# Patient Record
Sex: Female | Born: 1970 | Race: White | Hispanic: Yes | Marital: Married | Smoking: Never smoker
Health system: Southern US, Community
[De-identification: ages and names within clinical notes are randomized; demographics above are authoritative.]

## PROBLEM LIST (undated history)

## (undated) DIAGNOSIS — I10 Essential (primary) hypertension: Secondary | ICD-10-CM

## (undated) HISTORY — DX: Essential (primary) hypertension: I10

---

## 2020-04-20 ENCOUNTER — Other Ambulatory Visit: Payer: Self-pay

## 2020-04-20 DIAGNOSIS — Z1231 Encounter for screening mammogram for malignant neoplasm of breast: Secondary | ICD-10-CM

## 2020-05-23 ENCOUNTER — Ambulatory Visit: Payer: No Typology Code available for payment source | Admitting: *Deleted

## 2020-05-23 ENCOUNTER — Other Ambulatory Visit: Payer: Self-pay

## 2020-05-23 ENCOUNTER — Encounter (INDEPENDENT_AMBULATORY_CARE_PROVIDER_SITE_OTHER): Payer: Self-pay

## 2020-05-23 ENCOUNTER — Ambulatory Visit
Admission: RE | Admit: 2020-05-23 | Discharge: 2020-05-23 | Disposition: A | Discharge status: Home / Self Care | Payer: No Typology Code available for payment source | Source: Ambulatory Visit | Attending: Student | Admitting: Student

## 2020-05-23 VITALS — BP 168/118 | Wt 179.0 lb

## 2020-05-23 DIAGNOSIS — Z01419 Encounter for gynecological examination (general) (routine) without abnormal findings: Secondary | ICD-10-CM

## 2020-05-23 NOTE — Patient Instructions (Signed)
Explained breast self awareness with Jesusita Oka. Pap smear completed today. Let her know BCCCP will cover Pap smears and HPV typing every 5 years unless has a history of abnormal Pap smears. Referred patient to the [facility] of [facility] for a screening mammogram on the mobile unit. Appointment scheduled Tuesday, May 23, 2020 at 1140. Patient escorted to the mobile unit following BCCCP appointment for her screening mammogram. Let patient know will follow up with her within the next couple weeks with results of her Pap smear by letter or phone. Informed patient that the Breast Center will follow-up with her within the next couple of weeks with results of her mammogram by letter or phone. Jesusita Oka verbalized understanding.  Treon Kehl, Kathaleen Maser, RN 10:54 AM

## 2020-05-23 NOTE — Progress Notes (Signed)
Marisa Cox is a 50 y.o. G3P0 female who presents to [facility] clinic today with no complaints.    Pap Smear: Pap smear completed today. Last Pap smear was 3 years ago at a clinic in [place] that was located at [facility] and [employer] and was normal per patient. Per patient has no history of an abnormal Pap smear. Last Pap smear result is not available in Epic.   Physical exam: Breasts Left breast larger than right breast that per patient is normal for her. No skin abnormalities bilateral breasts. No nipple retraction bilateral breasts. No nipple discharge bilateral breasts. No lymphadenopathy. No lumps palpated bilateral breasts. No complaints of pain or tenderness on exam.      Pelvic/Bimanual Ext Genitalia No lesions, no swelling and no discharge observed on external genitalia.        Vagina Vagina pink and normal texture. No lesions and blood observed in vagina consistent with the end of patients menstrual period.        Cervix Cervix is present. Cervix pink and of normal texture. Blood observed on cervix that is consistent with the end of patients menstrual cycle.    Uterus Uterus is present and palpable. Uterus in normal position and normal size.        Adnexae Bilateral ovaries present and palpable. No tenderness on palpation.         Rectovaginal No rectal exam completed today since patient had no rectal complaints. No skin abnormalities observed on exam.     Smoking History: Patient has never smoked.   Patient Navigation: Patient education provided. Access to services provided for patient through [place] program. Spanish interpreter Natale Lay from [facility] provided.  Colorectal Cancer Screening: Per patient has never had colonoscopy completed. No complaints today.    Breast and Cervical Cancer Risk Assessment: Patient does not have family history of breast cancer, known genetic mutations, or radiation treatment to the chest before age 48. Patient does  not have history of cervical dysplasia, immunocompromised, or DES exposure in-utero.  Risk Assessment    Risk Scores      05/23/2020   Last edited by: Meryl Dare, CMA   5-year risk: 0.5 %   Lifetime risk: 5.2 %          A: BCCCP exam with pap smear No complaints.  P: Referred patient to the [facility] of [facility] for a screening mammogram on the mobile unit. Appointment scheduled Tuesday, May 23, 2020 at 1140.  Priscille Heidelberg, RN 05/23/2020 10:54 AM

## 2020-05-24 LAB — CYTOLOGY - PAP
Comment: NEGATIVE
Diagnosis: NEGATIVE
High risk HPV: NEGATIVE

## 2020-05-25 ENCOUNTER — Telehealth: Payer: Self-pay

## 2020-05-25 NOTE — Telephone Encounter (Signed)
Called patient to give pap smear results via Erika McReynolds, [employer]. Informed patient that pap smear was normal and HPV was negative. Based on this result her next pap smear will be due in 5 years. Patient voiced understanding.  

## 2020-05-26 ENCOUNTER — Other Ambulatory Visit: Payer: Self-pay | Admitting: Obstetrics and Gynecology

## 2020-05-26 DIAGNOSIS — R928 Other abnormal and inconclusive findings on diagnostic imaging of breast: Secondary | ICD-10-CM

## 2020-06-18 HISTORY — PX: BREAST BIOPSY: SHX20

## 2020-06-21 ENCOUNTER — Other Ambulatory Visit: Payer: Self-pay | Admitting: Obstetrics and Gynecology

## 2020-06-21 ENCOUNTER — Ambulatory Visit
Admission: RE | Admit: 2020-06-21 | Discharge: 2020-06-21 | Disposition: A | Discharge status: Home / Self Care | Payer: No Typology Code available for payment source | Source: Ambulatory Visit | Attending: Obstetrics and Gynecology | Admitting: Obstetrics and Gynecology

## 2020-06-21 ENCOUNTER — Other Ambulatory Visit: Payer: Self-pay

## 2020-06-21 DIAGNOSIS — R928 Other abnormal and inconclusive findings on diagnostic imaging of breast: Secondary | ICD-10-CM

## 2020-06-22 ENCOUNTER — Ambulatory Visit
Admission: RE | Admit: 2020-06-22 | Discharge: 2020-06-22 | Disposition: A | Discharge status: Home / Self Care | Payer: No Typology Code available for payment source | Source: Ambulatory Visit | Attending: Obstetrics and Gynecology | Admitting: Obstetrics and Gynecology

## 2020-06-22 ENCOUNTER — Other Ambulatory Visit: Payer: Self-pay | Admitting: Obstetrics and Gynecology

## 2020-06-22 DIAGNOSIS — R928 Other abnormal and inconclusive findings on diagnostic imaging of breast: Secondary | ICD-10-CM

## 2021-03-05 ENCOUNTER — Other Ambulatory Visit: Payer: Self-pay

## 2021-03-05 DIAGNOSIS — Z1231 Encounter for screening mammogram for malignant neoplasm of breast: Secondary | ICD-10-CM

## 2021-05-22 ENCOUNTER — Other Ambulatory Visit: Payer: Self-pay

## 2021-05-22 NOTE — Progress Notes (Signed)
error 

## 2021-05-24 ENCOUNTER — Ambulatory Visit: Payer: No Typology Code available for payment source | Admitting: *Deleted

## 2021-05-24 ENCOUNTER — Ambulatory Visit
Admission: RE | Admit: 2021-05-24 | Discharge: 2021-05-24 | Disposition: A | Discharge status: Home / Self Care | Payer: Self-pay | Source: Ambulatory Visit | Attending: Student | Admitting: Student

## 2021-05-24 VITALS — BP 124/76 | Wt 179.6 lb

## 2021-05-24 DIAGNOSIS — Z1211 Encounter for screening for malignant neoplasm of colon: Secondary | ICD-10-CM

## 2021-05-24 DIAGNOSIS — Z1231 Encounter for screening mammogram for malignant neoplasm of breast: Secondary | ICD-10-CM

## 2021-05-24 DIAGNOSIS — Z1239 Encounter for other screening for malignant neoplasm of breast: Secondary | ICD-10-CM

## 2021-05-24 NOTE — Patient Instructions (Signed)
Explained breast self awareness with Marisa Cox. Patient did not need a Pap smear today due to last Pap smear and HPV typing was 05/23/2020. Let her know BCCCP will cover Pap smears and HPV typing every 5 years unless has a history of abnormal Pap smears. Referred patient to the [facility] for a screening mammogram on the mobile unit. Appointment scheduled Thursday, May 24, 2021 at 0930. Patient aware of appointment and will be there. Let patient know the Breast Center will follow up with her within the next couple weeks with results of her mammogram by letter or phone. Marisa Cox verbalized understanding. ? ?Marisa Cox, Arvil Chaco, RN ?9:11 AM ? ? ? ? ?

## 2021-05-24 NOTE — Progress Notes (Signed)
[name] is a 51 y.o. female who presents to [facility] clinic today with no complaints.  ?  ?Pap Smear: Pap smear not completed today. Last Pap smear was 05/23/2020 at [facility] clinic and was normal with negative HPV. Per patient has no history of an abnormal Pap smear. Last Pap smear result is available in Epic. ?  ?Physical exam: ?Breasts ?Left breast larger than right breast that per patient is normal for her. No skin abnormalities bilateral breasts. No nipple retraction bilateral breasts. No nipple discharge bilateral breasts. No lymphadenopathy. No lumps palpated bilateral breasts. No complaints of pain or tenderness on exam. ? ?MM DIAG BREAST TOMO UNI LEFT ? ?Result Date: 06/21/2020 ?CLINICAL DATA:  Patient returns after baseline screening study for evaluation possible LEFT breast mass. EXAM: DIGITAL DIAGNOSTIC UNILATERAL LEFT MAMMOGRAM WITH TOMOSYNTHESIS AND CAD; ULTRASOUND LEFT BREAST LIMITED TECHNIQUE: Left digital diagnostic mammography and breast tomosynthesis was performed. The images were evaluated with computer-aided detection.; Targeted ultrasound examination of the left breast was performed COMPARISON:  Previous exam(s). ACR Breast Density Category b: There are scattered areas of fibroglandular density. FINDINGS: Additional 2-D and 3-D images are performed. These views confirm presence of an oval mass with irregular margins in the UPPER central anterior portion of the LEFT breast. Portions of the lesion appear lucent. On physical exam, there is no visible bruising in the UPPER central LEFT breast. Patient denies any known trauma. I palpate no abnormality in the UPPER central LEFT breast. Targeted ultrasound is performed, showing hyperechoic oval mass with angular margins and mild internal heterogeneity in the 12 o'clock location of the LEFT breast 1 centimeter from the nipple. Mass is 0.9 x 0.5 x 0.7 centimeters. Internal blood flow is confirmed on Doppler evaluation. No additional  abnormalities are identified in this location. Considerations include fat necrosis, hemangioma, and less likely, malignancy. Evaluation of the LEFT axilla is negative for adenopathy. IMPRESSION: Indeterminate mass in the 12 o'clock location of the LEFT breast 1 centimeter from the nipple. RECOMMENDATION: Recommend ultrasound-guided core biopsy of breast. I have discussed the findings and recommendations with the patient. If applicable, a reminder letter will be sent to the patient regarding the next appointment. BI-RADS CATEGORY  4: Suspicious. Electronically Signed   By: Marisa Cox M.D.   On: 06/21/2020 09:28  ? ?MS DIGITAL SCREENING TOMO BILATERAL ? ?Result Date: 05/25/2020 ?CLINICAL DATA:  Screening. EXAM: DIGITAL SCREENING BILATERAL MAMMOGRAM WITH TOMOSYNTHESIS AND CAD TECHNIQUE: Bilateral screening digital craniocaudal and mediolateral oblique mammograms were obtained. Bilateral screening digital breast tomosynthesis was performed. The images were evaluated with computer-aided detection. COMPARISON:  None. ACR Breast Density Category b: There are scattered areas of fibroglandular density. FINDINGS: In the left breast, a possible mass warrants further evaluation. In the right breast, no findings suspicious for malignancy. IMPRESSION: Further evaluation is suggested for a possible mass in the left breast. RECOMMENDATION: Diagnostic mammogram and possibly ultrasound of the left breast. (Code:[id number]) The patient will be contacted regarding the findings, and additional imaging will be scheduled. BI-RADS CATEGORY  0: Incomplete. Need additional imaging evaluation and/or prior mammograms for comparison. Electronically Signed   By: Marisa Cox M.D.   On: 05/25/2020 09:21  ? ?MM CLIP PLACEMENT LEFT ? ?Result Date: 06/22/2020 ?CLINICAL DATA:  Status post ultrasound-guided biopsy left breast mass. EXAM: DIAGNOSTIC LEFT MAMMOGRAM POST ULTRASOUND BIOPSY COMPARISON:  Previous exam(s). FINDINGS: Mammographic images  were obtained following ultrasound guided biopsy of left breast mass. The biopsy marking clip is in expected position at the site  of biopsy. IMPRESSION: Appropriate positioning of the ribbon shaped biopsy marking clip at the site of biopsy in the left breast 12 o'clock position. Final Assessment: Post Procedure Mammograms for Marker Placement Electronically Signed   By: Marisa Cox M.D.   On: 06/22/2020 08:27   ? ?Pelvic/Bimanual ?Pap is not indicated today per BCCCP guidelines. ?  ?Smoking History: ?Patient has never smoked. ?  ?Patient Navigation: ?Patient education provided. Access to services provided for patient through [place] program. Spanish interpreter Marisa Cox from [facility] provided.  ? ?Colorectal Cancer Screening: ?Per patient has never had colonoscopy completed. FIT Test given to patient today to complete. No complaints today.  ?  ?Breast and Cervical Cancer Risk Assessment: ?Patient does not have family history of breast cancer, known genetic mutations, or radiation treatment to the chest before age 69. Patient does not have history of cervical dysplasia, immunocompromised, or DES exposure in-utero. ? ?Risk Assessment   ? ? Risk Scores   ? ?   05/24/2021 05/23/2020  ? Last edited by: Marisa Rutherford, LPN Marisa Cox, CMA  ? 5-year risk: 0.7 % 0.5 %  ? Lifetime risk: 6 % 5.2 %  ? ?  ?  ? ?  ? ? ?A: ?BCCCP exam without pap smear ?No complaints. ? ?P: ?Referred patient to the [facility] of [facility] for a screening mammogram on the mobile unit. Appointment scheduled Thursday, May 24, 2021 at 0930. ?  ? ?Marisa Heidelberg, RN ?05/24/2021 9:11 AM   ?

## 2021-05-28 ENCOUNTER — Other Ambulatory Visit: Payer: Self-pay | Admitting: Obstetrics and Gynecology

## 2021-05-28 DIAGNOSIS — R928 Other abnormal and inconclusive findings on diagnostic imaging of breast: Secondary | ICD-10-CM

## 2021-06-25 ENCOUNTER — Ambulatory Visit
Admission: RE | Admit: 2021-06-25 | Discharge: 2021-06-25 | Disposition: A | Discharge status: Home / Self Care | Payer: No Typology Code available for payment source | Source: Ambulatory Visit | Attending: Obstetrics and Gynecology | Admitting: Obstetrics and Gynecology

## 2021-06-25 DIAGNOSIS — R928 Other abnormal and inconclusive findings on diagnostic imaging of breast: Secondary | ICD-10-CM

## 2022-03-14 ENCOUNTER — Telehealth: Payer: Self-pay

## 2022-03-14 NOTE — Telephone Encounter (Signed)
Telephoned patient using language line interpreter [phone]. Patient will call back with spouse's income and complete screening process with [facility].

## 2022-03-27 NOTE — Telephone Encounter (Signed)
Telephoned patient at mobile number. Left a voice message with [facility] (scholarship) contact information. Rudene Anda (interpreter)

## 2023-01-14 LAB — COLOGUARD: COLOGUARD: NEGATIVE

## 2023-07-17 IMAGING — US US BREAST*L* LIMITED INC AXILLA
1 series · 7 of 7 positions shown · non-contrast
Comparison: Previous exam(s).

CLINICAL DATA: Patient returns after screening study for evaluation
possible RIGHT breast asymmetry and possible LEFT breast asymmetry.



[Series 1: us breast*left* limited inc axilla · 0.06mm/px · 7 of 7 slices shown]
[im 1/7]
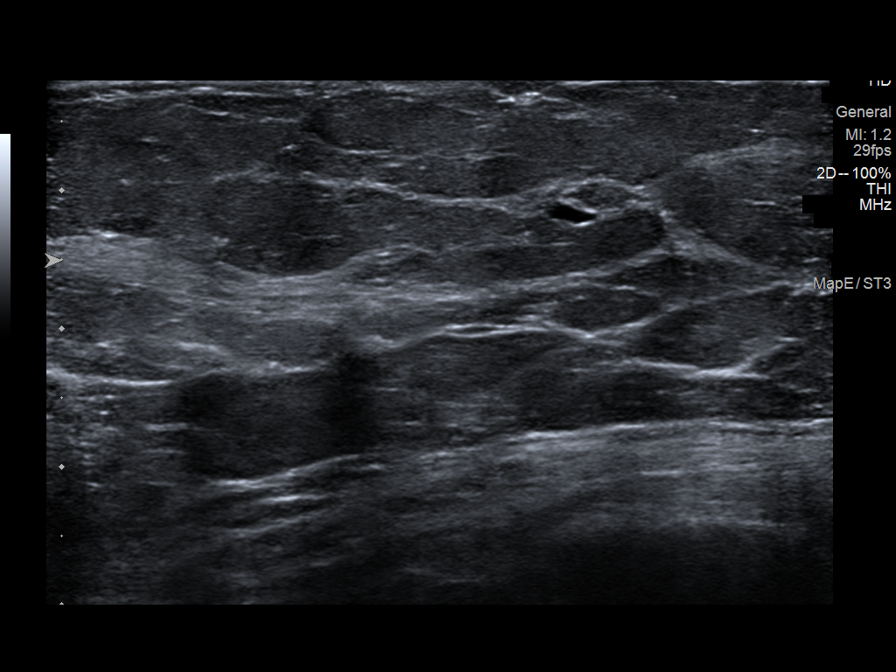
[im 2/7]
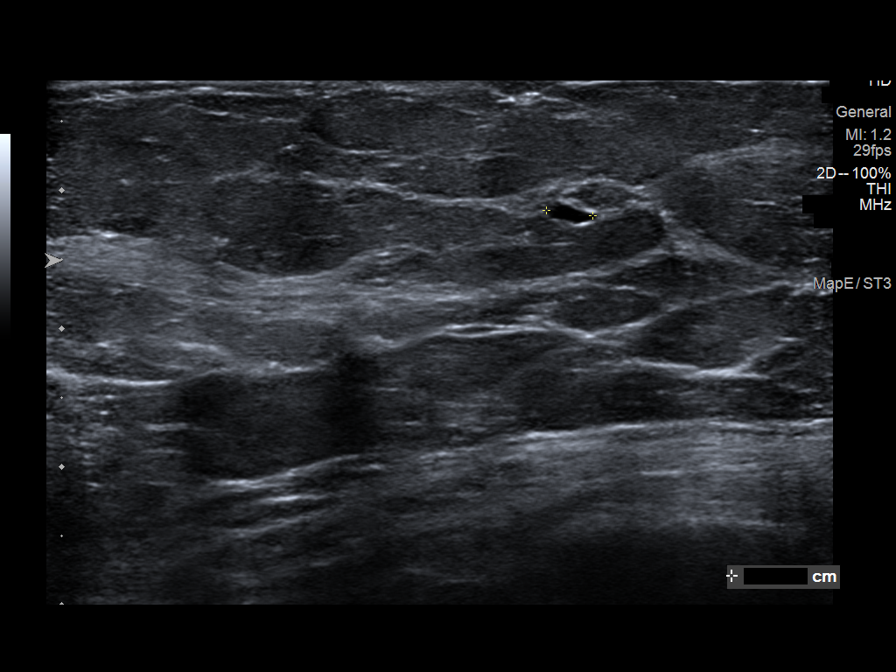
[im 3/7]
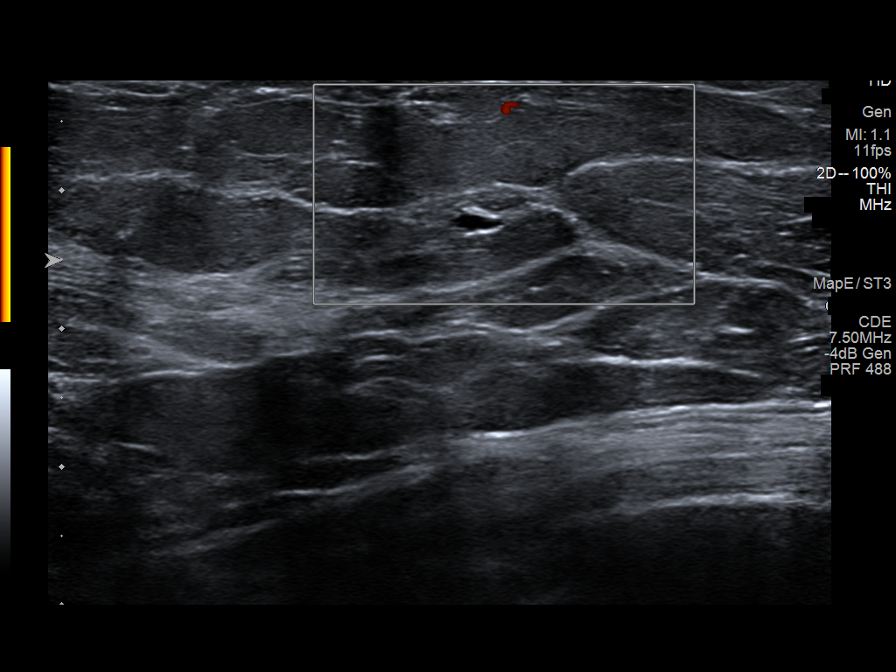
[im 4/7]
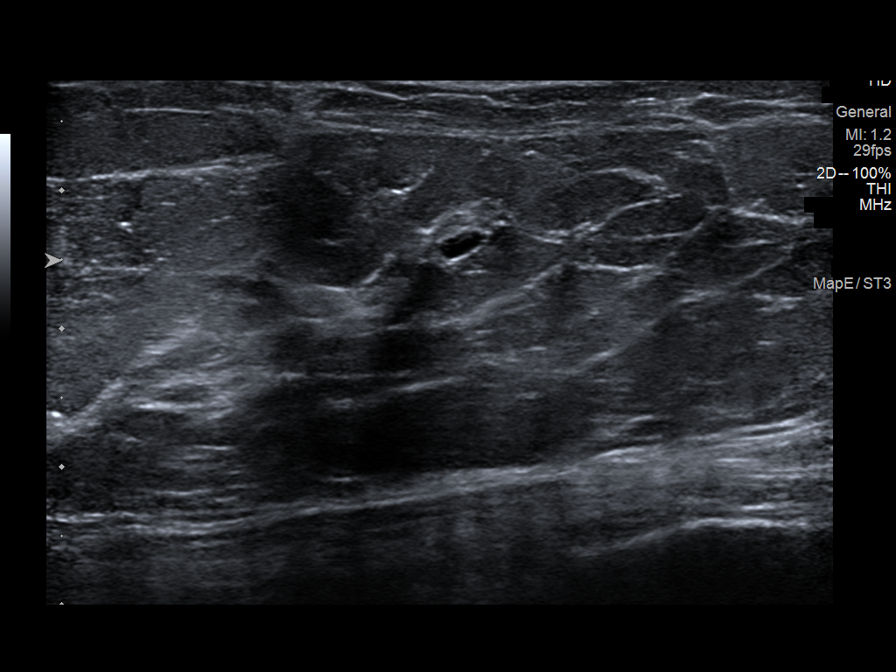
[im 5/7]
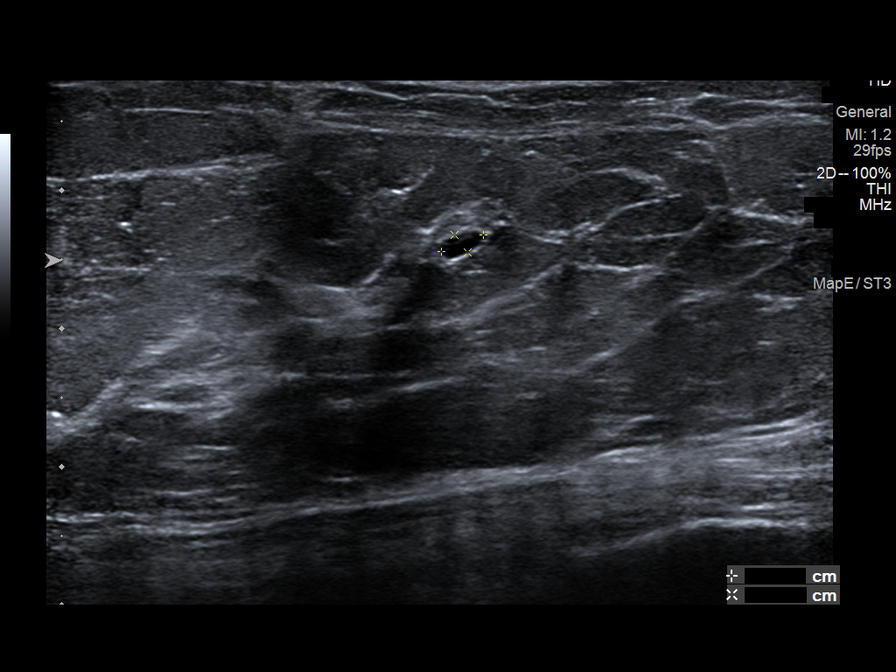
[im 6/7]
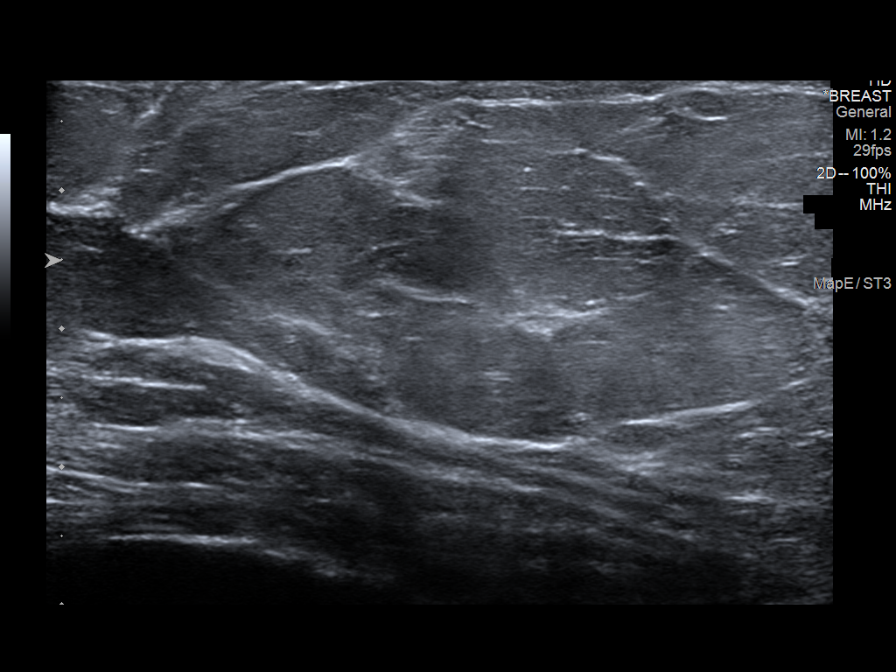
[im 7/7]
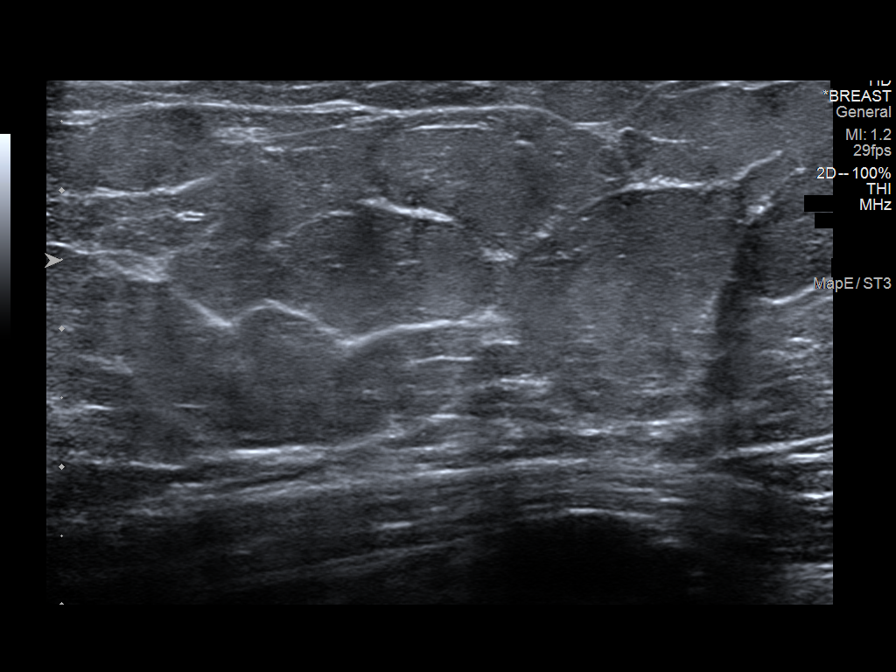

[7 of 7 positions shown; findings below may reference images not displayed]

ACR Breast Density Category b: There are scattered areas of
fibroglandular density.
FINDINGS: RIGHT BREAST:

Mammogram: Additional 2-D and 3-D images are performed. These views
confirm presence a low-attenuation partially obscured oval mass in
the LATERAL mid aspect of the RIGHT breast. Mammographic images were
processed with CAD.

Ultrasound: Targeted ultrasound is performed, showing a
circumscribed anechoic mass in the 9 o'clock location of the RIGHT
breast 3 centimeters from the nipple which measures 0.3 x 0.2 x
centimeters. No solid masses or areas of acoustic shadowing.

LEFT BREAST:

Mammogram: Additional 2-D and 3-D images are performed. These views
confirm presence of a circumscribed oval mass in the LATERAL aspect
of the LEFT breast and further evaluated with ultrasound.
Mammographic images were processed with CAD.

Ultrasound: Targeted ultrasound is performed, showing benign oval
cyst in the 3 o'clock location of the LEFT breast 4 centimeters from
the nipple which measures 0.3 x 0.3 x 0.2 centimeters.

Patient also reports itching in the MEDIAL portion of the LEFT
breast. Targeted ultrasound of this region is.
IMPRESSION: Benign fibrocystic changes bilaterally. No mammographic or
ultrasound evidence for malignancy.

RECOMMENDATION:
Screening mammogram in one year.(Code:BV-I-3D2)

I have discussed the findings and recommendations with the patient
with the assistance of an interpreter. If applicable, a reminder
letter will be sent to the patient regarding the next appointment.

BI-RADS CATEGORY  2: Benign.

## 2023-07-17 IMAGING — MG DIGITAL DIAGNOSTIC BILAT W/ TOMO W/ CAD
6 of 10 series · 6 of 30 positions shown · non-contrast
Comparison: Previous exam(s).

CLINICAL DATA: Patient returns after screening study for evaluation
possible RIGHT breast asymmetry and possible LEFT breast asymmetry.



[R ML synth-2D]
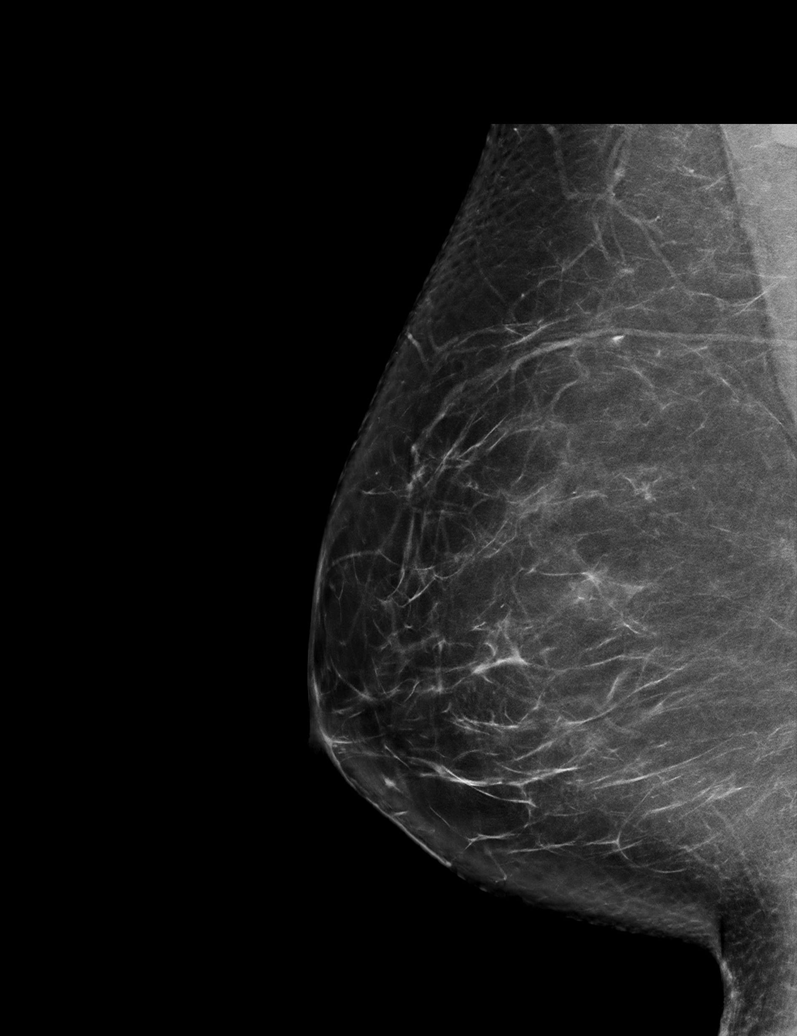

[L MLO synth-2D (1 of 2)]
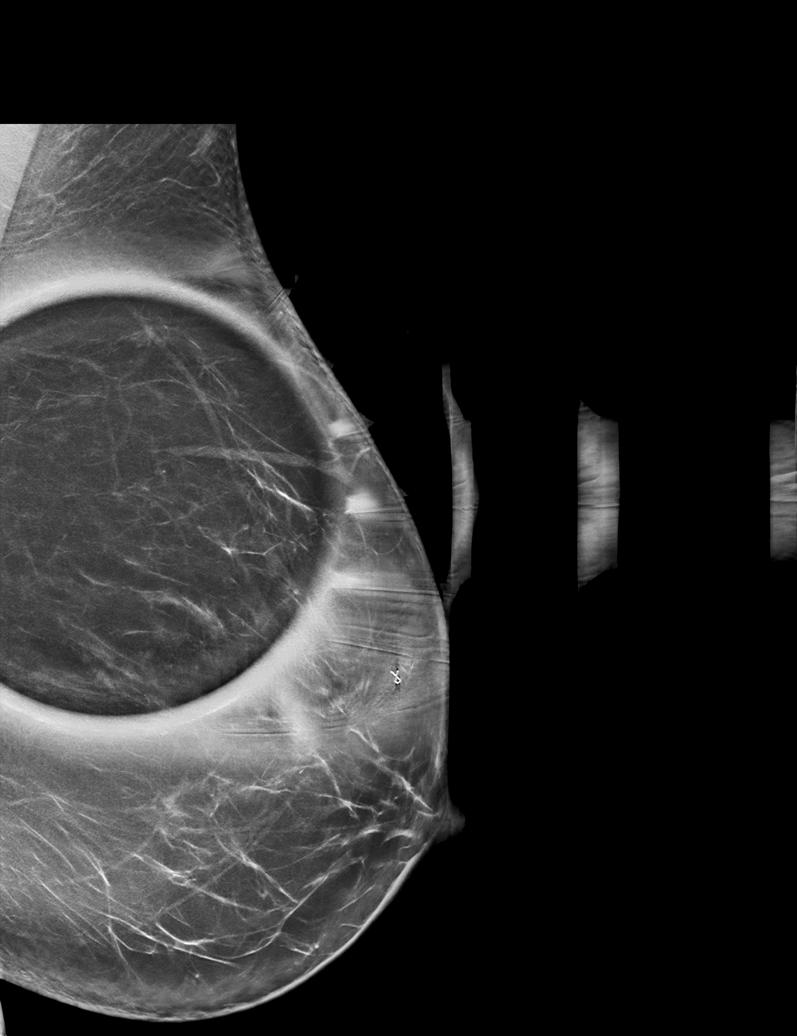

[L CC synth-2D]
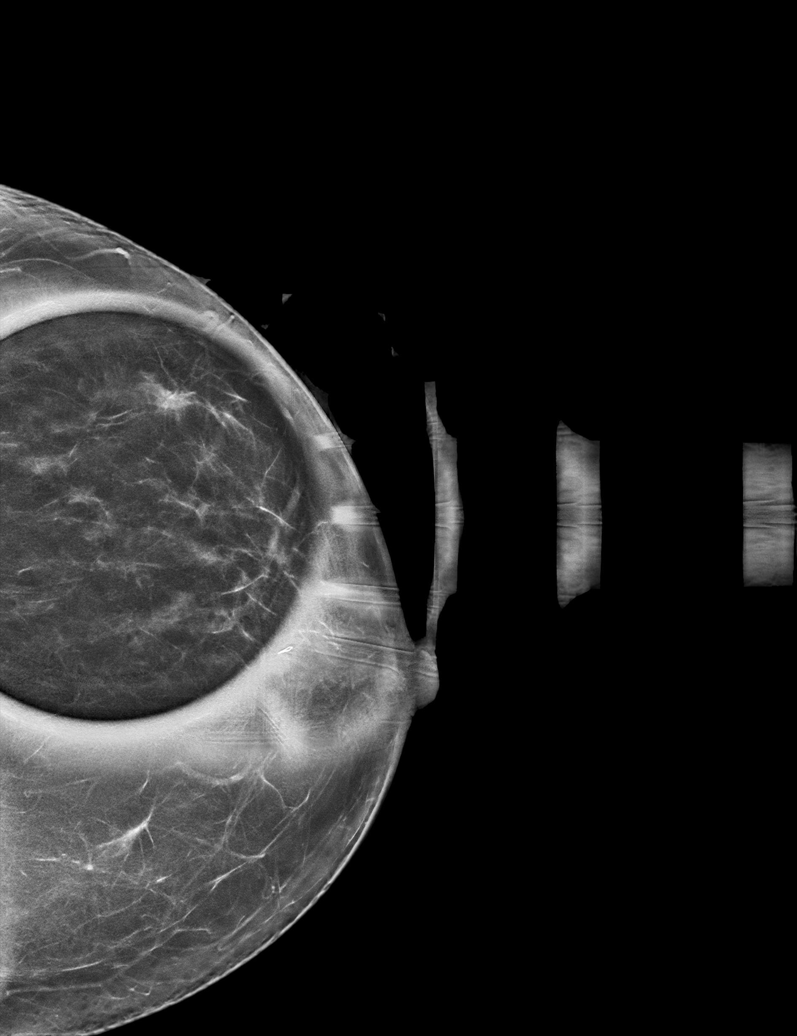

[L MLO synth-2D (2 of 2)]
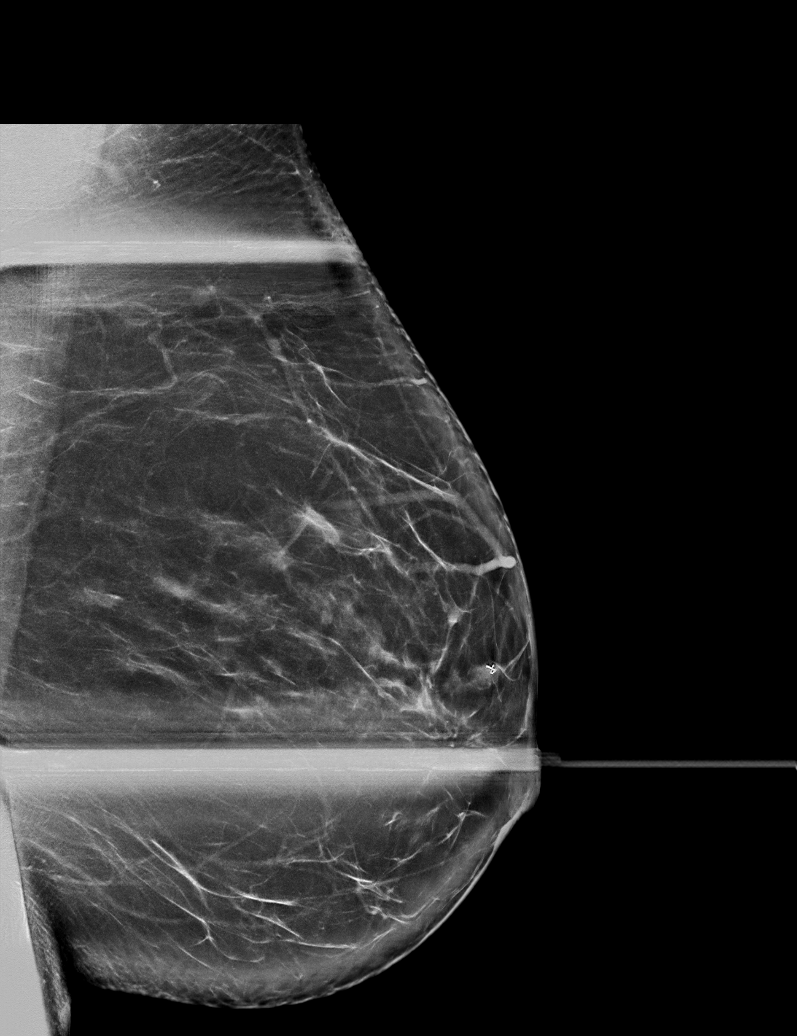

[R CC synth-2D]
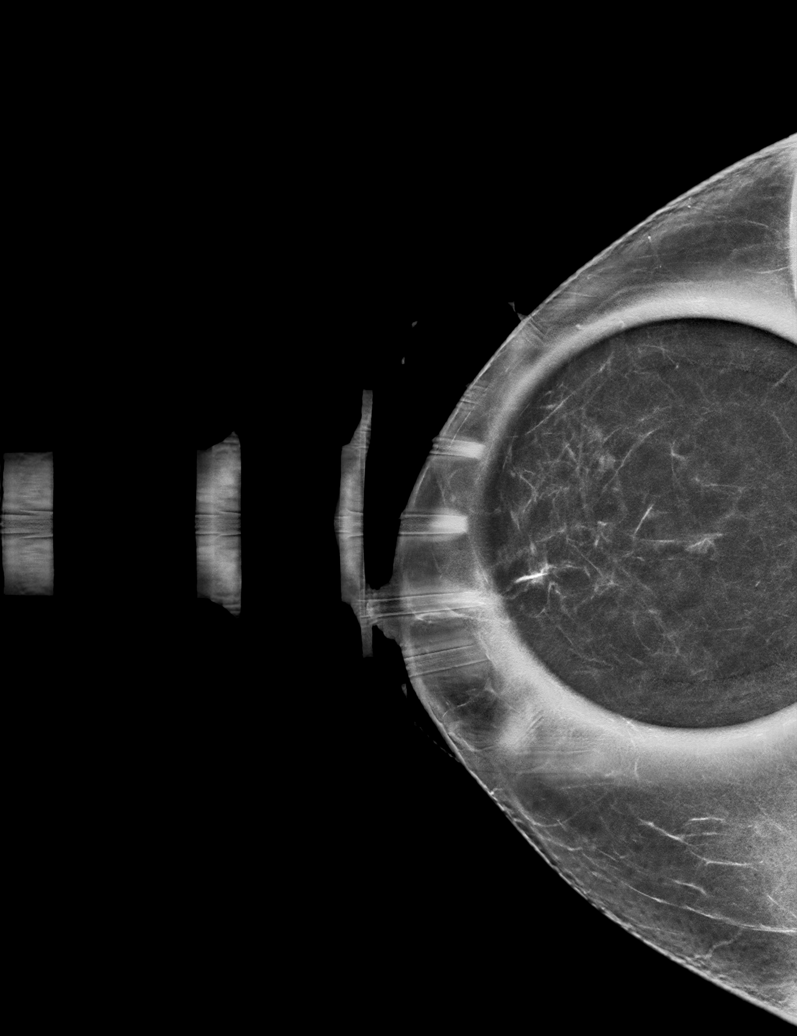

[L MLO tomo · tomo slice 45/88.0]
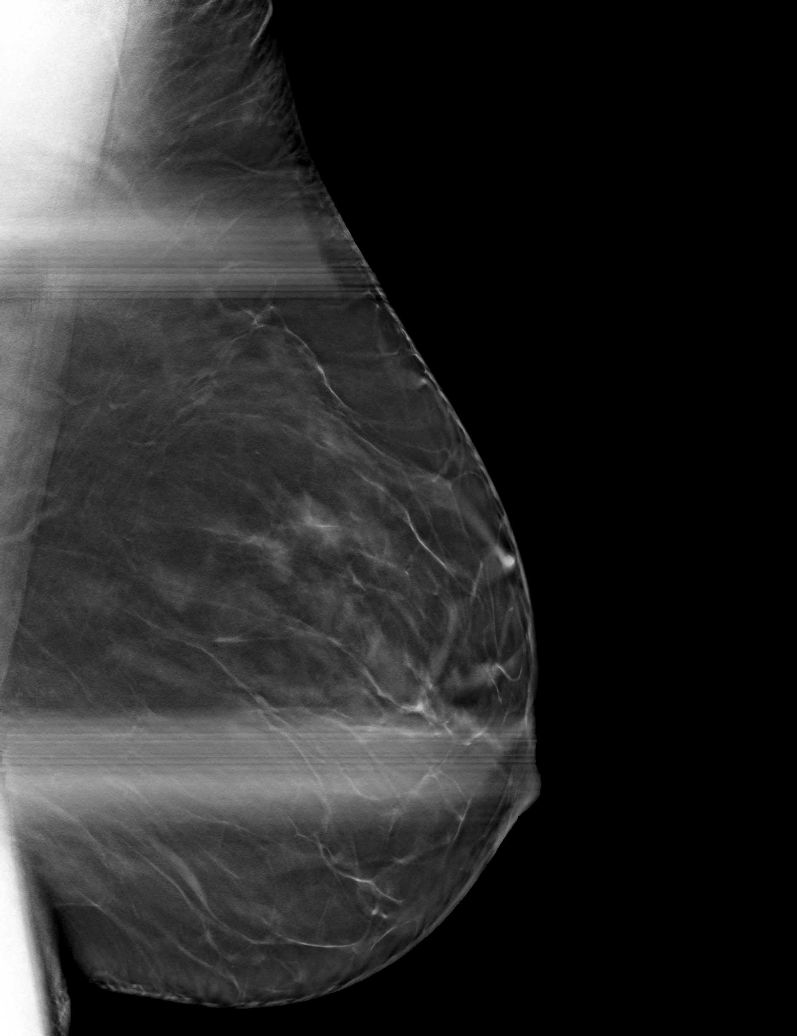

[6 of 30 positions shown; findings below may reference images not displayed]

ACR Breast Density Category b: There are scattered areas of
fibroglandular density.
FINDINGS: RIGHT BREAST:

Mammogram: Additional 2-D and 3-D images are performed. These views
confirm presence a low-attenuation partially obscured oval mass in
the LATERAL mid aspect of the RIGHT breast. Mammographic images were
processed with CAD.

Ultrasound: Targeted ultrasound is performed, showing a
circumscribed anechoic mass in the 9 o'clock location of the RIGHT
breast 3 centimeters from the nipple which measures 0.3 x 0.2 x
centimeters. No solid masses or areas of acoustic shadowing.

LEFT BREAST:

Mammogram: Additional 2-D and 3-D images are performed. These views
confirm presence of a circumscribed oval mass in the LATERAL aspect
of the LEFT breast and further evaluated with ultrasound.
Mammographic images were processed with CAD.

Ultrasound: Targeted ultrasound is performed, showing benign oval
cyst in the 3 o'clock location of the LEFT breast 4 centimeters from
the nipple which measures 0.3 x 0.3 x 0.2 centimeters.

Patient also reports itching in the MEDIAL portion of the LEFT
breast. Targeted ultrasound of this region is.
IMPRESSION: Benign fibrocystic changes bilaterally. No mammographic or
ultrasound evidence for malignancy.

RECOMMENDATION:
Screening mammogram in one year.(Code:[id number])

I have discussed the findings and recommendations with the patient
with the assistance of an interpreter. If applicable, a reminder
letter will be sent to the patient regarding the next appointment.

BI-RADS CATEGORY  2: Benign.

## 2024-03-10 ENCOUNTER — Other Ambulatory Visit: Payer: Self-pay | Admitting: Nurse Practitioner

## 2024-03-10 DIAGNOSIS — Z1231 Encounter for screening mammogram for malignant neoplasm of breast: Secondary | ICD-10-CM

## 2024-04-16 ENCOUNTER — Encounter
